# Patient Record
Sex: Female | Born: 1960 | Race: White | Hispanic: No | Marital: Married | Smoking: Current every day smoker
Health system: Southern US, Community
[De-identification: ages and names within clinical notes are randomized; demographics above are authoritative.]

## PROBLEM LIST (undated history)

## (undated) DIAGNOSIS — E785 Hyperlipidemia, unspecified: Secondary | ICD-10-CM

## (undated) DIAGNOSIS — G4700 Insomnia, unspecified: Secondary | ICD-10-CM

## (undated) HISTORY — DX: Hyperlipidemia, unspecified: E78.5

---

## 2012-01-27 HISTORY — PX: FOREARM SURGERY: SHX651

## 2016-05-22 ENCOUNTER — Ambulatory Visit (INDEPENDENT_AMBULATORY_CARE_PROVIDER_SITE_OTHER): Payer: BLUE CROSS/BLUE SHIELD | Admitting: Family Medicine

## 2016-05-22 ENCOUNTER — Encounter: Payer: Self-pay | Admitting: Family Medicine

## 2016-05-22 VITALS — BP 133/85 | HR 75 | Temp 97.1°F | Ht 62.0 in | Wt 144.0 lb

## 2016-05-22 DIAGNOSIS — F5101 Primary insomnia: Secondary | ICD-10-CM

## 2016-05-22 DIAGNOSIS — E782 Mixed hyperlipidemia: Secondary | ICD-10-CM | POA: Insufficient documentation

## 2016-05-22 DIAGNOSIS — Z0000 Encounter for general adult medical examination without abnormal findings: Secondary | ICD-10-CM

## 2016-05-22 NOTE — Progress Notes (Signed)
Subjective:  Patient ID: Yvette Rodgers. Yvette Rodgers, female    DOB: 09-25-1960  Age: 56 y.o. MRN: [id number]  CC: New Patient (Initial Visit) (pt here today to establish care and Work Physical)   HPI Yvette Rodgers presents for CPE. Homemaker.  Exercise - walking 2 miles daily. Smoker, but only takes 3-4 puffs per cigarette. Denies dyspnea. Afraid of chantix.  History Yvette Rodgers has a past medical history of Hyperlipidemia.   She has a past surgical history that includes Forearm surgery (Left, 2014).   Her family history includes Arthritis in her mother; Cancer in her sister; Hearing loss in her mother; Heart disease in her mother; Hypertension in her father and mother.She reports that she has been smoking.  She has been smoking about 0.50 packs per day. She has never used smokeless tobacco. She reports that she drinks about 1.8 oz of alcohol per week . She reports that she does not use drugs.  No current outpatient prescriptions on file prior to visit.   No current facility-administered medications on file prior to visit.     ROS Review of Systems  Constitutional: Negative for activity change, appetite change and fever.  HENT: Negative for congestion, rhinorrhea and sore throat.   Eyes: Negative for visual disturbance.  Respiratory: Negative for cough and shortness of breath.   Cardiovascular: Negative for chest pain and palpitations.  Gastrointestinal: Negative for abdominal pain, diarrhea and nausea.  Genitourinary: Negative for dysuria.  Musculoskeletal: Negative for arthralgias and myalgias.  Psychiatric/Behavioral: Positive for sleep disturbance (insomnia).    Objective:  BP 133/85   Pulse 75   Temp 97.1 F (36.2 C) (Oral)   Ht _0  (1.575 m)   Wt 144 lb (65.3 kg)   BMI 26.34 kg/m   Physical Exam  Constitutional: She is oriented to person, place, and time. She appears well-developed and well-nourished. No distress.  HENT:  Head: Normocephalic and atraumatic.  Right  Ear: External ear normal.  Left Ear: External ear normal.  Nose: Nose normal.  Mouth/Throat: Oropharynx is clear and moist.  Eyes: Conjunctivae and EOM are normal. Pupils are equal, round, and reactive to light.  Neck: Normal range of motion. Neck supple. No thyromegaly present.  Cardiovascular: Normal rate, regular rhythm and normal heart sounds.   No murmur heard. Pulmonary/Chest: Effort normal and breath sounds normal. No respiratory distress. She has no wheezes. She has no rales.  Abdominal: Soft. Normal appearance and bowel sounds are normal. She exhibits no distension, no abdominal bruit and no mass. There is no splenomegaly or hepatomegaly. There is no tenderness. There is no tenderness at McBurney's point and negative Murphy's sign.  Genitourinary: Rectum normal.  Musculoskeletal: Normal range of motion. She exhibits no edema or tenderness.  Lymphadenopathy:    She has no cervical adenopathy.  Neurological: She is alert and oriented to person, place, and time. She has normal reflexes.  Skin: Skin is warm and dry. No rash noted.  Psychiatric: She has a normal mood and affect. Her behavior is normal. Judgment and thought content normal.    Assessment & Plan:   Yvette Rodgers was seen today for new patient (initial visit).  Diagnoses and all orders for this visit:  Well adult exam -     CBC with Differential/Platelet -     CMP14+EGFR -     Lipid panel  Mixed hyperlipidemia  Primary insomnia   I am having Ms. Masih maintain her zolpidem, lovastatin, and lovastatin.  Meds ordered this encounter  Medications  .  zolpidem (AMBIEN) 10 MG tablet    Sig: Take 10 mg by mouth at bedtime as needed for sleep.  Marland Kitchen lovastatin (MEVACOR) 20 MG tablet    Sig: Take 20 mg by mouth at bedtime.  . lovastatin (MEVACOR) 40 MG tablet    Sig: Take 40 mg by mouth at bedtime.  Declined GYN exam, DEXA, mammogram. She wants to check with her insurance first. Multiple smoking cessation techniques  reviewed. Patient prefers to just taper little by little Follow-up: Return in about 6 months (around 11/21/2016), or if symptoms worsen or fail to improve, for cholesterol.  [name], M.D.

## 2016-05-23 LAB — CBC WITH DIFFERENTIAL/PLATELET
BASOS ABS: 0 10*3/uL (ref 0.0–0.2)
Basos: 1 %
EOS (ABSOLUTE): 0.2 10*3/uL (ref 0.0–0.4)
Eos: 2 %
Hematocrit: 45.4 % (ref 34.0–46.6)
Hemoglobin: 15.7 g/dL (ref 11.1–15.9)
IMMATURE GRANS (ABS): 0 10*3/uL (ref 0.0–0.1)
IMMATURE GRANULOCYTES: 0 %
LYMPHS: 46 %
Lymphocytes Absolute: 3.5 10*3/uL — ABNORMAL HIGH (ref 0.7–3.1)
MCH: 31 pg (ref 26.6–33.0)
MCHC: 34.6 g/dL (ref 31.5–35.7)
MCV: 90 fL (ref 79–97)
MONOS ABS: 0.5 10*3/uL (ref 0.1–0.9)
Monocytes: 7 %
NEUTROS ABS: 3.3 10*3/uL (ref 1.4–7.0)
NEUTROS PCT: 44 %
PLATELETS: 437 10*3/uL — AB (ref 150–379)
RBC: 5.06 x10E6/uL (ref 3.77–5.28)
RDW: 13.6 % (ref 12.3–15.4)
WBC: 7.6 10*3/uL (ref 3.4–10.8)

## 2016-05-23 LAB — CMP14+EGFR
A/G RATIO: 2 (ref 1.2–2.2)
ALT: 24 IU/L (ref 0–32)
AST: 17 IU/L (ref 0–40)
Albumin: 5 g/dL (ref 3.5–5.5)
Alkaline Phosphatase: 78 IU/L (ref 39–117)
BILIRUBIN TOTAL: 0.6 mg/dL (ref 0.0–1.2)
BUN/Creatinine Ratio: 18 (ref 9–23)
BUN: 15 mg/dL (ref 6–24)
CHLORIDE: 100 mmol/L (ref 96–106)
CO2: 22 mmol/L (ref 18–29)
Calcium: 10.3 mg/dL — ABNORMAL HIGH (ref 8.7–10.2)
Creatinine, Ser: 0.83 mg/dL (ref 0.57–1.00)
GFR calc non Af Amer: 78 mL/min/{1.73_m2} (ref >59)
GFR, EST AFRICAN AMERICAN: 90 mL/min/{1.73_m2} (ref >59)
GLUCOSE: 82 mg/dL (ref 65–99)
Globulin, Total: 2.5 g/dL (ref 1.5–4.5)
POTASSIUM: 5.1 mmol/L (ref 3.5–5.2)
Sodium: 140 mmol/L (ref 134–144)
TOTAL PROTEIN: 7.5 g/dL (ref 6.0–8.5)

## 2016-05-23 LAB — LIPID PANEL
CHOL/HDL RATIO: 3.6 ratio (ref 0.0–4.4)
Cholesterol, Total: 226 mg/dL — ABNORMAL HIGH (ref 100–199)
HDL: 62 mg/dL (ref >39)
LDL CALC: 107 mg/dL — AB (ref 0–99)
Triglycerides: 285 mg/dL — ABNORMAL HIGH (ref 0–149)
VLDL CHOLESTEROL CAL: 57 mg/dL — AB (ref 5–40)

## 2016-05-28 ENCOUNTER — Telehealth: Payer: Self-pay | Admitting: Family Medicine

## 2016-05-28 ENCOUNTER — Other Ambulatory Visit: Payer: Self-pay | Admitting: *Deleted

## 2016-05-28 MED ORDER — LOVASTATIN 40 MG PO TABS: 40.0000 mg | ORAL_TABLET | Freq: Every day | ORAL | 1 refills | Status: DC

## 2016-05-28 MED ORDER — LOVASTATIN 20 MG PO TABS: 20.0000 mg | ORAL_TABLET | Freq: Every day | ORAL | 1 refills | Status: DC

## 2016-05-28 MED ORDER — ZOLPIDEM TARTRATE 10 MG PO TABS: 10.0000 mg | ORAL_TABLET | Freq: Every evening | ORAL | 5 refills | Status: DC | PRN

## 2016-05-28 NOTE — Telephone Encounter (Signed)
scrip done, please call in.

## 2016-05-28 NOTE — Telephone Encounter (Signed)
Patient was seen 05/22/16. Please advise and send back to the pools.

## 2016-05-28 NOTE — Telephone Encounter (Signed)
Rx called in. Patient aware.  

## 2016-06-16 ENCOUNTER — Telehealth: Payer: Self-pay | Admitting: Family Medicine

## 2016-12-03 ENCOUNTER — Other Ambulatory Visit: Payer: Self-pay | Admitting: Family Medicine

## 2016-12-07 ENCOUNTER — Ambulatory Visit: Payer: PRIVATE HEALTH INSURANCE | Admitting: Family Medicine

## 2016-12-07 ENCOUNTER — Encounter: Payer: Self-pay | Admitting: Family Medicine

## 2016-12-07 VITALS — BP 128/79 | HR 84 | Temp 97.2°F | Ht 62.0 in | Wt 146.0 lb

## 2016-12-07 DIAGNOSIS — F5101 Primary insomnia: Secondary | ICD-10-CM | POA: Diagnosis not present

## 2016-12-07 DIAGNOSIS — E782 Mixed hyperlipidemia: Secondary | ICD-10-CM

## 2016-12-07 DIAGNOSIS — F4323 Adjustment disorder with mixed anxiety and depressed mood: Secondary | ICD-10-CM

## 2016-12-07 MED ORDER — ZOLPIDEM TARTRATE 10 MG PO TABS: 10.0000 mg | ORAL_TABLET | Freq: Every evening | ORAL | 5 refills | Status: DC | PRN

## 2016-12-07 MED ORDER — TRAZODONE HCL 150 MG PO TABS: 75.0000 mg | ORAL_TABLET | Freq: Every day | ORAL | 5 refills | Status: DC

## 2016-12-07 MED ORDER — LOVASTATIN 40 MG PO TABS: 80.0000 mg | ORAL_TABLET | Freq: Every day | ORAL | 2 refills | Status: DC
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
## 2016-12-08 LAB — CMP14+EGFR
ALBUMIN: 4.5 g/dL (ref 3.5–5.5)
ALT: 98 IU/L — AB (ref 0–32)
AST: 88 IU/L — ABNORMAL HIGH (ref 0–40)
Albumin/Globulin Ratio: 2 (ref 1.2–2.2)
Alkaline Phosphatase: 103 IU/L (ref 39–117)
BILIRUBIN TOTAL: 0.4 mg/dL (ref 0.0–1.2)
BUN/Creatinine Ratio: 18 (ref 9–23)
BUN: 15 mg/dL (ref 6–24)
CO2: 24 mmol/L (ref 20–29)
CREATININE: 0.85 mg/dL (ref 0.57–1.00)
Calcium: 9.6 mg/dL (ref 8.7–10.2)
Chloride: 106 mmol/L (ref 96–106)
GFR calc non Af Amer: 76 mL/min/{1.73_m2} (ref >59)
GFR, EST AFRICAN AMERICAN: 87 mL/min/{1.73_m2} (ref >59)
GLUCOSE: 90 mg/dL (ref 65–99)
Globulin, Total: 2.3 g/dL (ref 1.5–4.5)
POTASSIUM: 4.5 mmol/L (ref 3.5–5.2)
SODIUM: 144 mmol/L (ref 134–144)
TOTAL PROTEIN: 6.8 g/dL (ref 6.0–8.5)

## 2016-12-08 LAB — LIPID PANEL
CHOL/HDL RATIO: 2.6 ratio (ref 0.0–4.4)
Cholesterol, Total: 172 mg/dL (ref 100–199)
HDL: 66 mg/dL (ref >39)
LDL Calculated: 61 mg/dL (ref 0–99)
Triglycerides: 227 mg/dL — ABNORMAL HIGH (ref 0–149)
VLDL Cholesterol Cal: 45 mg/dL — ABNORMAL HIGH (ref 5–40)

## 2016-12-08 NOTE — Progress Notes (Signed)
There is a mild increase in the liver enzymes.  I recommend a repeat in about 4-6 weeks.  No change in therapy at this time.

## 2016-12-10 ENCOUNTER — Other Ambulatory Visit: Payer: Self-pay

## 2016-12-10 DIAGNOSIS — R748 Abnormal levels of other serum enzymes: Secondary | ICD-10-CM

## 2016-12-30 ENCOUNTER — Other Ambulatory Visit: Payer: Self-pay

## 2016-12-30 ENCOUNTER — Emergency Department (HOSPITAL_COMMUNITY)
Admission: EM | Admit: 2016-12-30 | Discharge: 2016-12-30 | Disposition: A | Discharge status: Home / Self Care | Payer: PRIVATE HEALTH INSURANCE | Attending: Emergency Medicine | Admitting: Emergency Medicine

## 2016-12-30 ENCOUNTER — Emergency Department (HOSPITAL_COMMUNITY): Payer: PRIVATE HEALTH INSURANCE

## 2016-12-30 ENCOUNTER — Encounter (HOSPITAL_COMMUNITY): Payer: Self-pay | Admitting: Emergency Medicine

## 2016-12-30 DIAGNOSIS — R911 Solitary pulmonary nodule: Secondary | ICD-10-CM

## 2016-12-30 DIAGNOSIS — Y999 Unspecified external cause status: Secondary | ICD-10-CM | POA: Insufficient documentation

## 2016-12-30 DIAGNOSIS — S0230XA Fracture of orbital floor, unspecified side, initial encounter for closed fracture: Secondary | ICD-10-CM

## 2016-12-30 DIAGNOSIS — Y9389 Activity, other specified: Secondary | ICD-10-CM | POA: Insufficient documentation

## 2016-12-30 DIAGNOSIS — S0990XA Unspecified injury of head, initial encounter: Secondary | ICD-10-CM | POA: Diagnosis present

## 2016-12-30 DIAGNOSIS — Y92410 Unspecified street and highway as the place of occurrence of the external cause: Secondary | ICD-10-CM | POA: Insufficient documentation

## 2016-12-30 DIAGNOSIS — S0231XA Fracture of orbital floor, right side, initial encounter for closed fracture: Secondary | ICD-10-CM | POA: Diagnosis not present

## 2016-12-30 LAB — BASIC METABOLIC PANEL
ANION GAP: 11 (ref 5–15)
BUN: 19 mg/dL (ref 6–20)
CHLORIDE: 106 mmol/L (ref 101–111)
CO2: 25 mmol/L (ref 22–32)
CREATININE: 0.93 mg/dL (ref 0.44–1.00)
Calcium: 9.8 mg/dL (ref 8.9–10.3)
GFR calc non Af Amer: >60 mL/min (ref >60)
GLUCOSE: 92 mg/dL (ref 65–99)
Potassium: 3.9 mmol/L (ref 3.5–5.1)
Sodium: 142 mmol/L (ref 135–145)

## 2016-12-30 LAB — PROTIME-INR
INR: 0.96
Prothrombin Time: 12.7 seconds (ref 11.4–15.2)

## 2016-12-30 LAB — CBC
HEMATOCRIT: 45.9 % (ref 36.0–46.0)
HEMOGLOBIN: 15.3 g/dL — AB (ref 12.0–15.0)
MCH: 30.7 pg (ref 26.0–34.0)
MCHC: 33.3 g/dL (ref 30.0–36.0)
MCV: 92.2 fL (ref 78.0–100.0)
Platelets: 393 10*3/uL (ref 150–400)
RBC: 4.98 MIL/uL (ref 3.87–5.11)
RDW: 12.7 % (ref 11.5–15.5)
WBC: 17.6 10*3/uL — ABNORMAL HIGH (ref 4.0–10.5)

## 2016-12-30 LAB — ETHANOL

## 2016-12-30 MED ORDER — TRAMADOL HCL 50 MG PO TABS: 50.0000 mg | ORAL_TABLET | Freq: Four times a day (QID) | ORAL | 0 refills | Status: DC | PRN

## 2016-12-30 MED ORDER — IOPAMIDOL (ISOVUE-300) INJECTION 61%
100.0000 mL | Freq: Once | INTRAVENOUS | Status: AC | PRN
  Administered 2016-12-30: 100 mL via INTRAVENOUS

## 2016-12-30 MED ORDER — HYDROCODONE-ACETAMINOPHEN 5-325 MG PO TABS
1.0000 | ORAL_TABLET | ORAL | Status: AC
  Administered 2016-12-30: 1 via ORAL
  Filled 2016-12-30: qty 1
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
## 2016-12-30 NOTE — Discharge Instructions (Signed)
Apply ice to help with the swelling, follow-up with the eye doctor and the facial trauma doctor, call to schedule a follow-up appointment.  Monitor for worsening ha, vomiting
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
## 2017-01-01 ENCOUNTER — Ambulatory Visit: Payer: PRIVATE HEALTH INSURANCE | Admitting: Family Medicine

## 2017-01-06 ENCOUNTER — Telehealth: Payer: Self-pay | Admitting: Family Medicine

## 2017-01-07 NOTE — Telephone Encounter (Signed)
Pt called to schedule appt for pt Pt declined earlier appt Will come in on 01/15/2017

## 2017-01-15 ENCOUNTER — Encounter: Payer: Self-pay | Admitting: Family Medicine

## 2017-01-15 ENCOUNTER — Ambulatory Visit: Payer: PRIVATE HEALTH INSURANCE | Admitting: Family Medicine

## 2017-01-15 VITALS — BP 115/74 | HR 90 | Temp 97.1°F | Ht 62.0 in | Wt 141.0 lb

## 2017-01-15 DIAGNOSIS — E782 Mixed hyperlipidemia: Secondary | ICD-10-CM

## 2017-01-15 DIAGNOSIS — F5101 Primary insomnia: Secondary | ICD-10-CM | POA: Diagnosis not present

## 2017-01-15 NOTE — Progress Notes (Signed)
Subjective:  Patient ID: Yvette Rodgers, female    DOB: 09-10-1960  Age: 56 y.o. MRN: [id number]  CC: Hyperlipidemia (pt here today for routine follow up of her chronic medical conditions, no other concerns voiced.)   HPI Yvette Rodgers presents for follow-up cholesterol is patient in for follow-up of elevated cholesterol. Doing well without complaints on current medication. Denies side effects of statin including myalgia and arthralgia and nausea. Also in today for liver function testing. Currently no chest pain, shortness of breath or other cardiovascular related symptoms noted.   [facility] 12/07/2016 05/22/2016  Decreased Interest 0 0  Down, Depressed, Hopeless 0 0  PHQ - 2 Score 0 0    History Yvette Hashimotoatricia has a past medical history of Hyperlipidemia.   She has a past surgical history that includes Forearm surgery (Left, 2014).   Her family history includes Arthritis in her mother; Cancer in her sister; Hearing loss in her mother; Heart disease in her mother; Hypertension in her father and mother.She reports that she has been smoking.  She has been smoking about 0.50 packs per day. she has never used smokeless tobacco. She reports that she drinks about 1.8 oz of alcohol per week. She reports that she does not use drugs.    ROS Review of Systems  Constitutional: Negative for activity change, appetite change and fever.  HENT: Negative for congestion, rhinorrhea and sore throat.   Eyes: Negative for visual disturbance.  Respiratory: Negative for cough and shortness of breath.   Cardiovascular: Negative for chest pain and palpitations.  Gastrointestinal: Negative for abdominal pain, diarrhea and nausea.  Genitourinary: Negative for dysuria.  Musculoskeletal: Negative for arthralgias and myalgias.    Objective:  BP 115/74   Pulse 90   Temp (!) 97.1 F (36.2 C) (Oral)   Ht 5\' 2"  (1.575 m)   Wt 141 lb (64 kg)   BMI 25.79 kg/m   BP Readings from Last 3  Encounters:  01/15/17 115/74  12/30/16 120/62  12/07/16 128/79    Wt Readings from Last 3 Encounters:  01/15/17 141 lb (64 kg)  12/30/16 150 lb (68 kg)  12/07/16 146 lb (66.2 kg)     Physical Exam  Constitutional: She is oriented to person, place, and time. She appears well-developed and well-nourished. No distress.  HENT:  Head: Normocephalic and atraumatic.  Right Ear: External ear normal.  Left Ear: External ear normal.  Nose: Nose normal.  Mouth/Throat: Oropharynx is clear and moist.  Eyes: Conjunctivae and EOM are normal. Pupils are equal, round, and reactive to light.  Neck: Normal range of motion. Neck supple. No thyromegaly present.  Cardiovascular: Normal rate, regular rhythm and normal heart sounds.  No murmur heard. Pulmonary/Chest: Effort normal and breath sounds normal. No respiratory distress. She has no wheezes. She has no rales.  Abdominal: Soft. Bowel sounds are normal. She exhibits no distension. There is no tenderness.  Lymphadenopathy:    She has no cervical adenopathy.  Neurological: She is alert and oriented to person, place, and time. She has normal reflexes.  Skin: Skin is warm and dry.  Psychiatric: She has a normal mood and affect. Her behavior is normal. Judgment and thought content normal.      Assessment & Plan:   Yvette Hashimotoatricia was seen today for hyperlipidemia.  Diagnoses and all orders for this visit:  Mixed hyperlipidemia  Primary insomnia       I am having Yvette HashimotoPatricia A. Montez Rodgers maintain her lovastatin, traZODone, zolpidem, and  traMADol.  Allergies as of 01/15/2017   No Known Allergies     Medication List        Accurate as of 01/15/17 11:59 PM. Always use your most recent med list.          lovastatin 40 MG tablet Commonly known as:  MEVACOR Take 2 tablets (80 mg total) at bedtime by mouth.   traMADol 50 MG tablet Commonly known as:  ULTRAM Take 1 tablet (50 mg total) by mouth every 6 (six) hours as needed.   traZODone  150 MG tablet Commonly known as:  DESYREL Take 0.5-1 tablets (75-150 mg total) at bedtime by mouth.   zolpidem 10 MG tablet Commonly known as:  AMBIEN Take 1 tablet (10 mg total) at bedtime as needed by mouth for sleep.        Follow-up: Return in about 6 months (around 07/16/2017).  Mechele ClaudeWarren Pang Robers, M.D.

## 2017-01-26 ENCOUNTER — Encounter: Payer: Self-pay | Admitting: Family Medicine

## 2017-02-17 ENCOUNTER — Encounter: Payer: Self-pay | Admitting: Family Medicine

## 2017-02-17 ENCOUNTER — Ambulatory Visit (INDEPENDENT_AMBULATORY_CARE_PROVIDER_SITE_OTHER): Payer: PRIVATE HEALTH INSURANCE | Admitting: Family Medicine

## 2017-02-17 VITALS — BP 129/87 | HR 83 | Temp 97.0°F | Ht 62.0 in | Wt 140.0 lb

## 2017-02-17 DIAGNOSIS — M545 Low back pain, unspecified: Secondary | ICD-10-CM

## 2017-02-17 MED ORDER — BETAMETHASONE SOD PHOS & ACET 6 (3-3) MG/ML IJ SUSP
6.0000 mg | Freq: Once | INTRAMUSCULAR | Status: AC
  Administered 2017-02-17: 6 mg via INTRAMUSCULAR

## 2017-02-17 MED ORDER — CYCLOBENZAPRINE HCL 10 MG PO TABS: 10.0000 mg | ORAL_TABLET | Freq: Three times a day (TID) | ORAL | 0 refills | Status: DC | PRN

## 2017-02-17 MED ORDER — DICLOFENAC SODIUM 75 MG PO TBEC: 75.0000 mg | DELAYED_RELEASE_TABLET | Freq: Two times a day (BID) | ORAL | 0 refills | Status: DC

## 2017-02-17 NOTE — Patient Instructions (Signed)
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
## 2017-03-13 ENCOUNTER — Emergency Department (HOSPITAL_COMMUNITY): Payer: PRIVATE HEALTH INSURANCE

## 2017-03-13 ENCOUNTER — Emergency Department (HOSPITAL_COMMUNITY)
Admission: EM | Admit: 2017-03-13 | Discharge: 2017-03-14 | Disposition: A | Discharge status: Home / Self Care | Payer: PRIVATE HEALTH INSURANCE | Attending: Emergency Medicine | Admitting: Emergency Medicine

## 2017-03-13 ENCOUNTER — Other Ambulatory Visit: Payer: Self-pay

## 2017-03-13 ENCOUNTER — Encounter (HOSPITAL_COMMUNITY): Payer: Self-pay | Admitting: *Deleted

## 2017-03-13 DIAGNOSIS — W19XXXA Unspecified fall, initial encounter: Secondary | ICD-10-CM | POA: Diagnosis not present

## 2017-03-13 DIAGNOSIS — E785 Hyperlipidemia, unspecified: Secondary | ICD-10-CM | POA: Insufficient documentation

## 2017-03-13 DIAGNOSIS — Z79899 Other long term (current) drug therapy: Secondary | ICD-10-CM | POA: Insufficient documentation

## 2017-03-13 DIAGNOSIS — Y998 Other external cause status: Secondary | ICD-10-CM | POA: Insufficient documentation

## 2017-03-13 DIAGNOSIS — Y929 Unspecified place or not applicable: Secondary | ICD-10-CM | POA: Insufficient documentation

## 2017-03-13 DIAGNOSIS — Y939 Activity, unspecified: Secondary | ICD-10-CM | POA: Diagnosis not present

## 2017-03-13 DIAGNOSIS — S0101XA Laceration without foreign body of scalp, initial encounter: Secondary | ICD-10-CM | POA: Insufficient documentation

## 2017-03-13 DIAGNOSIS — S0003XA Contusion of scalp, initial encounter: Secondary | ICD-10-CM

## 2017-03-13 DIAGNOSIS — F17200 Nicotine dependence, unspecified, uncomplicated: Secondary | ICD-10-CM | POA: Diagnosis not present

## 2017-03-13 HISTORY — DX: Insomnia, unspecified: G47.00

## 2017-03-13 MED ORDER — HYDROCODONE-ACETAMINOPHEN 5-325 MG PO TABS
1.0000 | ORAL_TABLET | Freq: Once | ORAL | Status: AC
  Administered 2017-03-13: 1 via ORAL
  Filled 2017-03-13: qty 1

## 2017-03-13 MED ORDER — NAPROXEN 250 MG PO TABS
500.0000 mg | ORAL_TABLET | Freq: Once | ORAL | Status: AC
  Administered 2017-03-14: 500 mg via ORAL
  Filled 2017-03-13: qty 2

## 2017-03-13 MED ORDER — TETANUS-DIPHTH-ACELL PERTUSSIS 5-2.5-18.5 LF-MCG/0.5 IM SUSP: 0.5000 mL | Freq: Once | INTRAMUSCULAR | Status: DC

## 2017-03-13 NOTE — ED Notes (Signed)
Dr Rhunette CroftNanavati has spoke with pt about having CT head scan. Pt has agreed to have scan.

## 2017-03-13 NOTE — ED Notes (Signed)
Yvette Rodgers in [place] came to transport pt for head CT- pt refused this scan. Dr Rhunette CroftNanavati aware.
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
## 2017-03-14 MED ORDER — ACETAMINOPHEN ER 650 MG PO TBCR: 650.0000 mg | EXTENDED_RELEASE_TABLET | Freq: Three times a day (TID) | ORAL | 0 refills | Status: AC

## 2017-03-14 MED ORDER — ACETAMINOPHEN 325 MG PO TABS
650.0000 mg | ORAL_TABLET | Freq: Once | ORAL | Status: AC
  Administered 2017-03-14: 650 mg via ORAL
  Filled 2017-03-14: qty 2

## 2017-03-14 MED ORDER — IBUPROFEN 400 MG PO TABS: 400.0000 mg | ORAL_TABLET | Freq: Four times a day (QID) | ORAL | 0 refills | Status: DC | PRN
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
## 2017-03-14 NOTE — Discharge Instructions (Signed)
We saw you in the ER after you had a fall and resultant laceration to the scalp. We had to put in 7 staples to your scalp. Please keep the area clean and dry.  You may start using shampoo after 48 hours. Get the staples removed in 10-14 days by her primary care doctor.

## 2017-03-24 ENCOUNTER — Encounter: Payer: Self-pay | Admitting: Family Medicine

## 2017-03-24 ENCOUNTER — Ambulatory Visit: Payer: PRIVATE HEALTH INSURANCE | Admitting: Family Medicine

## 2017-03-24 VITALS — BP 128/84 | HR 77 | Temp 97.2°F | Ht 62.0 in | Wt 140.0 lb

## 2017-03-24 DIAGNOSIS — S0003XA Contusion of scalp, initial encounter: Secondary | ICD-10-CM

## 2017-03-24 DIAGNOSIS — M5431 Sciatica, right side: Secondary | ICD-10-CM

## 2017-03-24 MED ORDER — BETAMETHASONE SOD PHOS & ACET 6 (3-3) MG/ML IJ SUSP
6.0000 mg | Freq: Once | INTRAMUSCULAR | Status: AC
  Administered 2017-03-24: 6 mg via INTRAMUSCULAR

## 2017-03-24 MED ORDER — CYCLOBENZAPRINE HCL 10 MG PO TABS: 10.0000 mg | ORAL_TABLET | Freq: Three times a day (TID) | ORAL | 0 refills | Status: DC | PRN

## 2017-03-24 NOTE — Progress Notes (Signed)
Chief Complaint  Patient presents with  . Suture / Staple Removal    pt here today to have staples removed from back of her head which were placed at ED     HPI  Patient presents today for recheck of her scalp wound.  She fell and hit her head about a week ago.  CT scan negative for injury but she had a laceration and several staples were placed.  She denies pain at this time.  No redness or swelling or drainage noted.  However, she reports that she hit her right hip and the pain that she was having last month at her previous visit has returned.  It is radiating down from the right buttock to the posterior lateral hip thigh and calf on the right.  She describes the pain as a burning explosive moderately severe sensation she is unable to get any sleep at all last night due to pain.  It seems to have been worsening over the last few days.  PMH: Smoking status noted ROS: Per HPI  Objective: BP 128/84   Pulse 77   Temp (!) 97.2 F (36.2 C) (Oral)   Ht 5\' 2"  (1.575 m)   Wt 140 lb (63.5 kg)   BMI 25.61 kg/m  Gen: NAD, alert, cooperative with exam HEENT: [place], EOMI, PERRL.  The lesion at the posterior parietal scalp on the left was inspected after suture removal revealing no sign of infection and with good healing. CV: RRR, good S1/S2, no murmur Resp: CTABL, no wheezes, non-labored Abd: SNTND, BS present, no guarding or organomegaly Ext: No edema, warm full range of motion right lower extremity.  Tenderness noted at sciatic notch.  There is mild tenderness with straight leg raise above 45 degrees.  Right lower extremity otherwise neurovascularly intact. Neuro: Alert and oriented, No gross deficits  Assessment and plan:  1. Sciatica of right side   2. Contusion of scalp, initial encounter     Meds ordered this encounter  Medications  . betamethasone acetate-betamethasone sodium phosphate (CELESTONE) injection 6 mg  . cyclobenzaprine (FLEXERIL) 10 MG tablet    Sig: Take 1 tablet (10 mg  total) by mouth 3 (three) times daily as needed for muscle spasms.    Dispense:  90 tablet    Refill:  0    Wound care reviewed with patient  Follow up as needed.  Mechele ClaudeWarren Ovie Eastep, MD

## 2017-03-30 ENCOUNTER — Ambulatory Visit: Payer: PRIVATE HEALTH INSURANCE | Admitting: Family Medicine

## 2017-03-31 ENCOUNTER — Other Ambulatory Visit: Payer: Self-pay | Admitting: Family Medicine

## 2017-03-31 ENCOUNTER — Telehealth: Payer: Self-pay | Admitting: Family Medicine

## 2017-03-31 MED ORDER — ZOLPIDEM TARTRATE 10 MG PO TABS: 10.0000 mg | ORAL_TABLET | Freq: Every evening | ORAL | 5 refills | Status: DC | PRN

## 2017-03-31 NOTE — Telephone Encounter (Signed)
What is the name of the medication? zolpidem (AMBIEN) 10 MG tablet  Have you contacted your pharmacy to request a refill? Yes told to call us  Which pharmacy would you like this sent to? [facility] in [place]   Patient notified that their request is being sent to the clinical staff for review and that they should receive a call once it is complete. If they do not receive a call within 24 hours they can check with their pharmacy or our office.

## 2017-03-31 NOTE — Telephone Encounter (Signed)
I sent in the requested prescription 

## 2017-03-31 NOTE — Telephone Encounter (Signed)
Patient aware.

## 2017-05-24 ENCOUNTER — Other Ambulatory Visit: Payer: Self-pay | Admitting: Family Medicine

## 2017-06-08 ENCOUNTER — Encounter: Payer: Self-pay | Admitting: Family

## 2017-06-08 ENCOUNTER — Ambulatory Visit: Payer: PRIVATE HEALTH INSURANCE | Admitting: Family

## 2017-06-08 VITALS — BP 97/62 | HR 90 | Temp 101.0°F | Ht 62.0 in | Wt 142.4 lb

## 2017-06-08 DIAGNOSIS — N12 Tubulo-interstitial nephritis, not specified as acute or chronic: Secondary | ICD-10-CM

## 2017-06-08 DIAGNOSIS — I959 Hypotension, unspecified: Secondary | ICD-10-CM | POA: Diagnosis not present

## 2017-06-08 DIAGNOSIS — R6889 Other general symptoms and signs: Secondary | ICD-10-CM

## 2017-06-08 DIAGNOSIS — M5431 Sciatica, right side: Secondary | ICD-10-CM

## 2017-06-08 DIAGNOSIS — R109 Unspecified abdominal pain: Secondary | ICD-10-CM

## 2017-06-08 LAB — VERITOR FLU A/B WAIVED
INFLUENZA A: NEGATIVE
INFLUENZA B: NEGATIVE

## 2017-06-08 MED ORDER — CIPROFLOXACIN HCL 500 MG PO TABS: 500.0000 mg | ORAL_TABLET | Freq: Two times a day (BID) | ORAL | 0 refills | Status: DC

## 2017-06-08 MED ORDER — CYCLOBENZAPRINE HCL 10 MG PO TABS: 10.0000 mg | ORAL_TABLET | Freq: Three times a day (TID) | ORAL | 0 refills | Status: DC | PRN

## 2017-06-08 NOTE — Progress Notes (Signed)
Subjective:    Patient ID: Yvette Rodgers, female    DOB: [birth year], 57 y.o.   MRN: [id number]  Chief Complaint  Patient presents with  . Back Pain  . Fever  . Headache  . Nausea  . leg aches   Pt presents to the office today with fevers for the last three days. States she has never been this sick before. She is also complaining of bilateral flank pain.   States she is having recurrent sciatic pain down her right leg and this is causing her not to sleep because of the pain.  Fever   This is a new problem. The current episode started in the past 7 days (three days ago). The problem occurs constantly. The maximum temperature noted was 102 to 102.9 F. Associated symptoms include coughing ("a little"), diarrhea (once), ear pain, headaches, muscle aches, nausea, sleepiness and a sore throat. Pertinent negatives include no congestion, urinary pain or vomiting. She has tried acetaminophen and fluids for the symptoms. The treatment provided mild relief.  Back Pain  This is a recurrent problem. Associated symptoms include a fever and headaches. Pertinent negatives include no dysuria.      Review of Systems  Constitutional: Positive for fever.  HENT: Positive for ear pain and sore throat. Negative for congestion.   Respiratory: Positive for cough ("a little").   Gastrointestinal: Positive for diarrhea (once) and nausea. Negative for vomiting.  Genitourinary: Negative for dysuria.  Musculoskeletal: Positive for back pain.  Neurological: Positive for headaches.  All other systems reviewed and are negative.      Objective:   Physical Exam  Constitutional: She is oriented to person, place, and time. She appears well-developed and well-nourished. She has a sickly appearance. No distress.  HENT:  Head: Normocephalic and atraumatic.  Right Ear: External ear normal.  Mouth/Throat: Oropharynx is clear and moist.  Eyes: Pupils are equal, round, and reactive to light.  Neck: Normal range  of motion. Neck supple. No thyromegaly present.  Cardiovascular: Normal rate, regular rhythm, normal heart sounds and intact distal pulses.  No murmur heard. Pulmonary/Chest: Effort normal and breath sounds normal. No respiratory distress. She has no wheezes.  Abdominal: Soft. Bowel sounds are normal. She exhibits no distension. There is no tenderness.  Musculoskeletal: Normal range of motion. She exhibits no edema or tenderness.  Neurological: She is alert and oriented to person, place, and time. She has normal reflexes. No cranial nerve deficit.  Skin: Skin is warm and dry.  Psychiatric: She has a normal mood and affect. Her behavior is normal. Judgment and thought content normal.  Vitals reviewed.  Flu negative    BP 97/62   Pulse 90   Temp (!) 101 F (38.3 C) (Oral)   Ht  (1.575 m)   Wt 142 lb 6.4 oz (64.6 kg)   BMI 26.05 kg/m      Assessment & Plan:  [name] was seen today for back pain, fever, headache, nausea and leg aches.  Diagnoses and all orders for this visit:  Flu-like symptoms -     Veritor Flu A/B Waived  Pyelonephritis -     ciprofloxacin (CIPRO) 500 MG tablet; Take 1 tablet (500 mg total) by mouth 2 (two) times daily.  Flank pain  Sciatica of right side -     cyclobenzaprine (FLEXERIL) 10 MG tablet; Take 1 tablet (10 mg total) by mouth 3 (three) times daily as needed for muscle spasms.  Hypotension, unspecified hypotension type   PT refuses  any urine tests or other work up stating she wants the "bare minium" as they are still paying on her hospital bill. I discussed the importance of lab work and urinalysis, but states she will talk with her husband and if she does not get better will come back for another appointment.  I worry about pyelonephritis with flank pain and high fever. Pt is hypotensive today and we discussed the importance of forcing fluids. But if symptoms do not improve or worsen she will need to go to ED.   Jannifer Rodney, FNP

## 2017-06-08 NOTE — Patient Instructions (Signed)
Pyelonephritis, Adult Pyelonephritis is a kidney infection. The kidneys are organs that help clean your blood by moving waste out of your blood and into your pee (urine). This infection can happen quickly, or it can last for a long time. In most cases, it clears up with treatment and does not cause other problems. Follow these instructions at home: Medicines  Take over-the-counter and prescription medicines only as told by your doctor.  Take your antibiotic medicine as told by your doctor. Do not stop taking the medicine even if you start to feel better. General instructions  Drink enough fluid to keep your pee clear or pale yellow.  Avoid caffeine, tea, and carbonated drinks.  Pee (urinate) often. Avoid holding in pee for long periods of time.  Pee before and after sex.  After pooping (having a bowel movement), women should wipe from front to back. Use each tissue only once.  Keep all follow-up visits as told by your doctor. This is important. Contact a doctor if:  You do not feel better after 2 days.  Your symptoms get worse.  You have a fever. Get help right away if:  You cannot take your medicine or drink fluids as told.  You have chills and shaking.  You throw up (vomit).  You have very bad pain in your side (flank) or back.  You feel very weak or you pass out (faint). This information is not intended to replace advice given to you by your health care provider. Make sure you discuss any questions you have with your health care provider. Document Released: 02/20/2004 Document Revised: 06/20/2015 Document Reviewed: 05/07/2014 Elsevier Interactive Patient Education  2018 [employer]  

## 2017-06-09 ENCOUNTER — Other Ambulatory Visit: Payer: PRIVATE HEALTH INSURANCE

## 2017-06-09 ENCOUNTER — Ambulatory Visit: Payer: PRIVATE HEALTH INSURANCE | Admitting: Family Medicine

## 2017-06-09 DIAGNOSIS — R109 Unspecified abdominal pain: Secondary | ICD-10-CM

## 2017-06-09 DIAGNOSIS — N12 Tubulo-interstitial nephritis, not specified as acute or chronic: Secondary | ICD-10-CM

## 2017-06-09 LAB — URINALYSIS, COMPLETE
Bilirubin, UA: NEGATIVE
GLUCOSE, UA: NEGATIVE
Leukocytes, UA: NEGATIVE
NITRITE UA: NEGATIVE
Specific Gravity, UA: 1.015 (ref 1.005–1.030)
Urobilinogen, Ur: 0.2 mg/dL (ref 0.2–1.0)
pH, UA: 5.5 (ref 5.0–7.5)

## 2017-06-09 LAB — MICROSCOPIC EXAMINATION
Epithelial Cells (non renal): >10 /hpf — AB (ref 0–10)
Renal Epithel, UA: NONE SEEN /hpf

## 2017-06-09 NOTE — Addendum Note (Signed)
Addended by: Almeta Monas on: 06/09/2017 02:33 PM   Modules accepted: Orders

## 2017-07-26 ENCOUNTER — Ambulatory Visit (INDEPENDENT_AMBULATORY_CARE_PROVIDER_SITE_OTHER): Payer: PRIVATE HEALTH INSURANCE | Admitting: Family Medicine

## 2017-07-26 ENCOUNTER — Encounter: Payer: Self-pay | Admitting: Family Medicine

## 2017-07-26 VITALS — BP 129/85 | HR 79 | Temp 97.2°F | Ht 62.0 in | Wt 136.0 lb

## 2017-07-26 DIAGNOSIS — M5416 Radiculopathy, lumbar region: Secondary | ICD-10-CM | POA: Diagnosis not present

## 2017-07-26 MED ORDER — PREDNISONE 10 MG PO TABS: ORAL_TABLET | ORAL | 0 refills | Status: DC

## 2017-07-26 MED ORDER — GABAPENTIN 300 MG PO CAPS: ORAL_CAPSULE | ORAL | 0 refills | Status: DC
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
## 2017-08-28 ENCOUNTER — Other Ambulatory Visit: Payer: Self-pay | Admitting: Family Medicine

## 2017-08-30 NOTE — Telephone Encounter (Signed)
Last lipid 12/07/16

## 2017-08-31 ENCOUNTER — Ambulatory Visit: Payer: PRIVATE HEALTH INSURANCE | Admitting: Family Medicine

## 2017-10-01 ENCOUNTER — Other Ambulatory Visit: Payer: Self-pay | Admitting: Family Medicine

## 2017-10-01 NOTE — Telephone Encounter (Signed)
Last seen 07/26/17  Dr Darlyn Read

## 2017-11-29 ENCOUNTER — Other Ambulatory Visit: Payer: Self-pay | Admitting: Family Medicine

## 2017-11-29 NOTE — Telephone Encounter (Signed)
Last seen 07/26/17

## 2017-12-17 ENCOUNTER — Telehealth: Payer: Self-pay | Admitting: Family Medicine

## 2017-12-20 ENCOUNTER — Other Ambulatory Visit: Payer: Self-pay | Admitting: Family Medicine

## 2017-12-20 MED ORDER — ZOLPIDEM TARTRATE 10 MG PO TABS: 10.0000 mg | ORAL_TABLET | Freq: Every evening | ORAL | 2 refills | Status: DC | PRN

## 2017-12-20 MED ORDER — DICLOFENAC SODIUM 75 MG PO TBEC: 75.0000 mg | DELAYED_RELEASE_TABLET | Freq: Two times a day (BID) | ORAL | 1 refills | Status: DC

## 2017-12-20 MED ORDER — GABAPENTIN 300 MG PO CAPS: ORAL_CAPSULE | ORAL | 1 refills | Status: DC

## 2017-12-20 MED ORDER — LOVASTATIN 40 MG PO TABS: 80.0000 mg | ORAL_TABLET | Freq: Every day | ORAL | 1 refills | Status: DC

## 2017-12-20 NOTE — Telephone Encounter (Signed)
Patient aware.

## 2017-12-20 NOTE — Telephone Encounter (Signed)
I sent in the requested prescription 

## 2017-12-28 ENCOUNTER — Telehealth: Payer: Self-pay | Admitting: *Deleted

## 2017-12-28 NOTE — Telephone Encounter (Signed)
Advised pt she would ntbs per Dr Darlyn ReadStacks and she states she lives in [place] and will probably be moving to another state in a few months. Advised pt she would need to find a provider near where she lives. Pt voiced understanding.

## 2017-12-28 NOTE — Telephone Encounter (Signed)
Pt. Needs to be seen for this. Thanks, WS 

## 2017-12-28 NOTE — Telephone Encounter (Signed)
VM from patient regarding Ambien Ambien will be due in a couple of days, this was called into [facility] on 12/20/17 #30 with 2 refills but [facility] not be [facility] [place] since it is a controlled substance If appropriate please send new Rx

## 2018-10-05 ENCOUNTER — Encounter: Payer: Self-pay | Admitting: Family Medicine

## 2018-10-05 ENCOUNTER — Ambulatory Visit (INDEPENDENT_AMBULATORY_CARE_PROVIDER_SITE_OTHER): Payer: PRIVATE HEALTH INSURANCE | Admitting: Family Medicine

## 2018-10-05 DIAGNOSIS — F5101 Primary insomnia: Secondary | ICD-10-CM

## 2018-10-05 DIAGNOSIS — M5431 Sciatica, right side: Secondary | ICD-10-CM | POA: Diagnosis not present

## 2018-10-05 DIAGNOSIS — E782 Mixed hyperlipidemia: Secondary | ICD-10-CM

## 2018-10-05 MED ORDER — ZOLPIDEM TARTRATE 10 MG PO TABS: 10.0000 mg | ORAL_TABLET | Freq: Every evening | ORAL | 2 refills | Status: DC | PRN

## 2018-10-05 NOTE — Progress Notes (Signed)
    Subjective:    Patient ID: Yvette Rodgers. [place], female    DOB: 1960-08-03, 58 y.o.   MRN: [id number]   HPI: Yvette Rodgers. [name] is a 59 y.o. female presenting for recheck of lower back. Went to [place] last week and back is fine. No more problems with back or leg. Didn't get MRI as a result. Taking lovastatin and ambien, but nothing for her back. No pain during  Drive to [facility] did not exacerbate pain.    Depression screen [facility] 2/9 07/26/2017 06/08/2017 03/24/2017 02/17/2017 12/07/2016  Decreased Interest 0 0 0 0 0  Down, Depressed, Hopeless 0 0 0 0 0  PHQ - 2 Score 0 0 0 0 0     Relevant past medical, surgical, family and social history reviewed and updated as indicated.  Interim medical history since our last visit reviewed. Allergies and medications reviewed and updated.  ROS:  Review of Systems  Constitutional: Negative.   HENT: Negative.   Eyes: Negative for visual disturbance.  Respiratory: Negative for shortness of breath.   Cardiovascular: Negative for chest pain.  Gastrointestinal: Negative for abdominal pain.  Musculoskeletal: Negative for arthralgias.     Social History   Tobacco Use  Smoking Status Current Every Day Smoker  . Packs/day: 0.50  Smokeless Tobacco Never Used       Objective:     Wt Readings from Last 3 Encounters:  07/26/17 136 lb (61.7 kg)  06/08/17 142 lb 6.4 oz (64.6 kg)  03/24/17 140 lb (63.5 kg)     Exam deferred. Pt. Harboring due to COVID 19. Phone visit performed.   Assessment & Plan:   1. Primary insomnia   2. Mixed hyperlipidemia   3. Sciatica of right side     Meds ordered this encounter  Medications  . zolpidem (AMBIEN) 10 MG tablet    Sig: Take 1 tablet (10 mg total) by mouth at bedtime as needed. for sleep    Dispense:  30 tablet    Refill:  2    No orders of the defined types were placed in this encounter.     Diagnoses and all orders for this visit:  Primary insomnia  Mixed hyperlipidemia  Sciatica  of right side  Other orders -     zolpidem (AMBIEN) 10 MG tablet; Take 1 tablet (10 mg total) by mouth at bedtime as needed. for sleep    Virtual Visit via telephone Note  I discussed the limitations, risks, security and privacy concerns of performing an evaluation and management service by telephone and the availability of in person appointments. The patient was identified with two identifiers. Pt.expressed understanding and agreed to proceed. Pt. Is at home. Dr. Livia Snellen is in his office.  Follow Up Instructions:   I discussed the assessment and treatment plan with the patient. The patient was provided an opportunity to ask questions and all were answered. The patient agreed with the plan and demonstrated an understanding of the instructions.   The patient was advised to call back or seek an in-person evaluation if the symptoms worsen or if the condition fails to improve as anticipated.   Total minutes including chart review and phone contact time: 18   Follow up plan: Return in about 6 months (around 04/04/2019).  [name], MD [place]

## 2018-10-28 ENCOUNTER — Other Ambulatory Visit: Payer: Self-pay | Admitting: Family Medicine

## 2018-11-09 ENCOUNTER — Ambulatory Visit: Payer: PRIVATE HEALTH INSURANCE | Admitting: Family Medicine

## 2018-12-30 ENCOUNTER — Other Ambulatory Visit: Payer: Self-pay | Admitting: Family Medicine

## 2019-02-01 ENCOUNTER — Ambulatory Visit (INDEPENDENT_AMBULATORY_CARE_PROVIDER_SITE_OTHER): Payer: PRIVATE HEALTH INSURANCE | Admitting: Family Medicine

## 2019-02-01 ENCOUNTER — Encounter: Payer: Self-pay | Admitting: Family Medicine

## 2019-02-01 DIAGNOSIS — E782 Mixed hyperlipidemia: Secondary | ICD-10-CM

## 2019-02-01 DIAGNOSIS — F4321 Adjustment disorder with depressed mood: Secondary | ICD-10-CM | POA: Diagnosis not present

## 2019-02-01 DIAGNOSIS — F5101 Primary insomnia: Secondary | ICD-10-CM

## 2019-02-01 DIAGNOSIS — F411 Generalized anxiety disorder: Secondary | ICD-10-CM | POA: Diagnosis not present

## 2019-02-01 MED ORDER — ZOLPIDEM TARTRATE 10 MG PO TABS: 10.0000 mg | ORAL_TABLET | Freq: Every day | ORAL | 5 refills | Status: DC

## 2019-02-01 MED ORDER — LOVASTATIN 40 MG PO TABS: 80.0000 mg | ORAL_TABLET | Freq: Every day | ORAL | 5 refills | Status: DC

## 2019-02-01 NOTE — Progress Notes (Signed)
Subjective:    Patient ID: Yvette Rodgers, female    DOB: Apr 15, 1960, 59 y.o.   MRN: [id number]   HPI: Yvette Rodgers. Yvette Rodgers is a 60 y.o. female presenting for check up on insomnia. Ambien taken at 9 PM. Bedtime at 10 PM. Watches TV until bedtime. Takes about 1/2-1 hour to get to sleep. Then awakening close to 3 AM. Preoccupied by death of nephews. "My mind keeps moving." when trying to sleep.Drinks tea and  At 8:30 will drink a soda.  Lost 2 nephews in Dec. Unknown causes for he 40 year old. The other passed at age 29 on Dec. 31 due to cancer. Now awaiting cremation. Pt. Planning trip to funeral in [place].    Depression screen Yvette Rodgers 2/9 07/26/2017 06/08/2017 03/24/2017 02/17/2017 12/07/2016  Decreased Interest 0 0 0 0 0  Down, Depressed, Hopeless 0 0 0 0 0  PHQ - 2 Score 0 0 0 0 0     Relevant past medical, surgical, family and social history reviewed and updated as indicated.  Interim medical history since our last visit reviewed. Allergies and medications reviewed and updated.  ROS:  Review of Systems   Social History   Tobacco Use  Smoking Status Current Every Day Smoker  . Packs/day: 0.50  Smokeless Tobacco Never Used       Objective:     Wt Readings from Last 3 Encounters:  07/26/17 136 lb (61.7 kg)  06/08/17 142 lb 6.4 oz (64.6 kg)  03/24/17 140 lb (63.5 kg)     Exam deferred. Pt. Harboring due to COVID 19. Phone visit performed.   Assessment & Plan:   1. Primary insomnia   2. Anxiety reaction   3. Grieving   4. Mixed hyperlipidemia     Meds ordered this encounter  Medications  . zolpidem (AMBIEN) 10 MG tablet    Sig: Take 1 tablet (10 mg total) by mouth at bedtime.    Dispense:  30 tablet    Refill:  5  . lovastatin (MEVACOR) 40 MG tablet    Sig: Take 2 tablets (80 mg total) by mouth at bedtime.    Dispense:  60 tablet    Refill:  5    No orders of the defined types were placed in this encounter.     Diagnoses and all orders for this  visit:  Primary insomnia  Anxiety reaction  Grieving  Mixed hyperlipidemia  Other orders -     zolpidem (AMBIEN) 10 MG tablet; Take 1 tablet (10 mg total) by mouth at bedtime. -     lovastatin (MEVACOR) 40 MG tablet; Take 2 tablets (80 mg total) by mouth at bedtime.   Support offered for grief reaction. Pt. Expressed appreciation. Ambien not entirely  Effective. We discussed sleep hygiene and pt will avoid Tea & soda after noon. She will stop screen time one hour before bed. She will switch her daily bathing to one hour before bedtime.  Virtual Visit via telephone Note  I discussed the limitations, risks, security and privacy concerns of performing an evaluation and management service by telephone and the availability of in person appointments. The patient was identified with two identifiers. Pt.expressed understanding and agreed to proceed. Pt. Is at home. Dr. Darlyn Read is in his office.  Follow Up Instructions:   I discussed the assessment and treatment plan with the patient. The patient was provided an opportunity to ask questions and all were answered. The patient agreed with the plan and demonstrated an  understanding of the instructions.   The patient was advised to call back or seek an in-person evaluation if the symptoms worsen or if the condition fails to improve as anticipated.   Total minutes including chart review and phone contact time: 25   Follow up plan: No follow-ups on file.  Yvette Fraise, MD [place]

## 2019-02-01 NOTE — Patient Instructions (Signed)

## 2019-04-04 ENCOUNTER — Ambulatory Visit (INDEPENDENT_AMBULATORY_CARE_PROVIDER_SITE_OTHER): Payer: PRIVATE HEALTH INSURANCE | Admitting: Family Medicine

## 2019-04-04 ENCOUNTER — Encounter: Payer: Self-pay | Admitting: Family Medicine

## 2019-04-04 DIAGNOSIS — F418 Other specified anxiety disorders: Secondary | ICD-10-CM | POA: Diagnosis not present

## 2019-04-04 DIAGNOSIS — F5101 Primary insomnia: Secondary | ICD-10-CM | POA: Diagnosis not present

## 2019-04-04 DIAGNOSIS — F322 Major depressive disorder, single episode, severe without psychotic features: Secondary | ICD-10-CM | POA: Diagnosis not present

## 2019-04-04 MED ORDER — DULOXETINE HCL 30 MG PO CPEP: 30.0000 mg | ORAL_CAPSULE | Freq: Every day | ORAL | 0 refills | Status: DC

## 2019-04-04 NOTE — Progress Notes (Signed)
Subjective:    Patient ID: [name], female    DOB: Jul 07, 1960, 59 y.o.   MRN: [id number]   HPI: Yvette Rodgers is a 59 y.o. female presenting for under so much stress. Husband away for 9 months. I just don't know what to do. Takes Strong but then up again. Husband coming home on 3/14, but leaving after a few days for another job. Can't eat or sleeping. She has DCed Pepsi and other forms of caffeine. "I've never been like this before. " Can't relax, nervous. Tearful.   Depression screen [facility] 2/9 07/26/2017 06/08/2017 03/24/2017 02/17/2017 12/07/2016  Decreased Interest 0 0 0 0 0  Down, Depressed, Hopeless 0 0 0 0 0  PHQ - 2 Score 0 0 0 0 0     Relevant past medical, surgical, family and social history reviewed and updated as indicated.  Interim medical history since our last visit reviewed. Allergies and medications reviewed and updated.  ROS:  Review of Systems  Constitutional: Negative.   HENT: Negative.   Eyes: Negative for visual disturbance.  Respiratory: Negative for shortness of breath.   Cardiovascular: Negative for chest pain.  Gastrointestinal: Negative for abdominal pain.  Musculoskeletal: Negative for arthralgias.  Psychiatric/Behavioral: Positive for agitation, confusion, decreased concentration, dysphoric mood and sleep disturbance. The patient is nervous/anxious.      Social History   Tobacco Use  Smoking Status Current Every Day Smoker  . Packs/day: 0.50  Smokeless Tobacco Never Used       Objective:     Wt Readings from Last 3 Encounters:  07/26/17 136 lb (61.7 kg)  06/08/17 142 lb 6.4 oz (64.6 kg)  03/24/17 140 lb (63.5 kg)     Exam deferred. Pt. Harboring due to COVID 19. Phone visit performed.   Assessment & Plan:   1. Depression, major, single episode, severe (HCC)   2. Anxiety associated with depression   3. Primary insomnia     Meds ordered this encounter  Medications  . DULoxetine (CYMBALTA) 30 MG capsule    Sig: Take 1  capsule (30 mg total) by mouth daily. For one week then two daily. Take with a full stomach at suppertime    Dispense:  60 capsule    Refill:  0    No orders of the defined types were placed in this encounter.     Diagnoses and all orders for this visit:  Depression, major, single episode, severe (HCC) -     DULoxetine (CYMBALTA) 30 MG capsule; Take 1 capsule (30 mg total) by mouth daily. For one week then two daily. Take with a full stomach at suppertime  Anxiety associated with depression -     DULoxetine (CYMBALTA) 30 MG capsule; Take 1 capsule (30 mg total) by mouth daily. For one week then two daily. Take with a full stomach at suppertime  Primary insomnia  I believe her sleep will improve once she initiates the Cymbalta.  In the meantime this patient is socially isolated due to her husband's working far away.  She cannot go with him because he lives in a hotel where he is going and she has to take care of their animals.  Sometimes when there is a campground available she will go with him but unfortunately he is in the [place] where they campgrounds are closed due to the cold winter weather.  Covid tends to increase her isolation and anxiety as well.  She was encouraged for self-care with exercise careful diet and  we discussed trying to approach her narrative from a positive angle.  Virtual Visit via telephone Note  I discussed the limitations, risks, security and privacy concerns of performing an evaluation and management service by telephone and the availability of in person appointments. The patient was identified with two identifiers. Pt.expressed understanding and agreed to proceed. Pt. Is at home. Dr. Livia Snellen is in his office.  Follow Up Instructions:   I discussed the assessment and treatment plan with the patient. The patient was provided an opportunity to ask questions and all were answered. The patient agreed with the plan and demonstrated an understanding of the  instructions.   The patient was advised to call back or seek an in-person evaluation if the symptoms worsen or if the condition fails to improve as anticipated.   Total minutes including chart review and phone contact time: 21   Follow up plan: Return in about 1 month (around 05/05/2019).  [name], MD [place]

## 2019-04-05 ENCOUNTER — Telehealth: Payer: Self-pay | Admitting: Family Medicine

## 2019-04-05 NOTE — Telephone Encounter (Signed)
Patient states she has not had any side effects but was worried she might.  Will let us know if side effects occur.

## 2019-04-18 ENCOUNTER — Ambulatory Visit (INDEPENDENT_AMBULATORY_CARE_PROVIDER_SITE_OTHER): Payer: PRIVATE HEALTH INSURANCE | Admitting: Family Medicine

## 2019-04-18 ENCOUNTER — Encounter: Payer: Self-pay | Admitting: Family Medicine

## 2019-04-18 ENCOUNTER — Other Ambulatory Visit: Payer: Self-pay

## 2019-04-18 VITALS — BP 134/81 | HR 72 | Temp 98.6°F | Ht 62.0 in | Wt 144.8 lb

## 2019-04-18 DIAGNOSIS — F418 Other specified anxiety disorders: Secondary | ICD-10-CM | POA: Diagnosis not present

## 2019-04-18 DIAGNOSIS — F322 Major depressive disorder, single episode, severe without psychotic features: Secondary | ICD-10-CM | POA: Diagnosis not present

## 2019-04-18 DIAGNOSIS — F5101 Primary insomnia: Secondary | ICD-10-CM

## 2019-04-18 HISTORY — DX: Major depressive disorder, single episode, severe without psychotic features: F32.2

## 2019-04-18 MED ORDER — DULOXETINE HCL 60 MG PO CPEP: 60.0000 mg | ORAL_CAPSULE | Freq: Two times a day (BID) | ORAL | 2 refills | Status: AC

## 2019-04-18 NOTE — Progress Notes (Signed)
Subjective:  Patient ID: Yvette Rodgers. Cwik, female    DOB: 03-03-1960  Age: 59 y.o. MRN: [id number]  CC: Follow-up (2 week)   HPI Deania A. Profitt presents for med not doing anything for depression. Ambien  Helps with sleep. Still tearful due to missing her husband.  He was supposed to come home a week or so ago but emergency at his work in [facility] kept him from coming.  She has animals at home that keep her from traveling.  She has a 35 year old cat and a 34 year old Cocker spaniel.  Due to age she cannot give them away.  However she does not want to have them put down.  Generally when she travels with him she stays on time ground for the sake of her animals.  Unfortunately she is not able to do that due to the cold weather and [place] through the winter.  She is hoping to go with him to remain in the springtime, in May, when the campgrounds there start to open up.  In the meantime she gets some relief with Ambien but not enough.  She is still distraught and feels hopeless and sad and lonely.  PHQ 2 performed.  Does not show the extent of her symptoms.  Depression screen [facility] 2/9 04/18/2019 07/26/2017 06/08/2017  Decreased Interest 0 0 0  Down, Depressed, Hopeless 1 0 0  PHQ - 2 Score 1 0 0    History Tyquisha has a past medical history of Depression, major, single episode, severe (HCC) (04/18/2019), Hyperlipidemia, and Insomnia.   She has a past surgical history that includes Forearm surgery (Left, 2014).   Her family history includes Arthritis in her mother; Cancer in her sister; Hearing loss in her mother; Heart disease in her mother; Hypertension in her father and mother.She reports that she has been smoking. She has been smoking about 0.50 packs per day. She has never used smokeless tobacco. She reports current alcohol use of about 3.0 standard drinks of alcohol per week. She reports that she does not use drugs.    ROS Review of Systems  Constitutional: Negative.   HENT:  Negative.   Eyes: Negative for visual disturbance.  Respiratory: Negative for shortness of breath.   Cardiovascular: Negative for chest pain.  Gastrointestinal: Negative for abdominal pain.  Musculoskeletal: Negative for arthralgias.    Objective:  BP 134/81   Pulse 72   Temp 98.6 F (37 C) (Temporal)   Ht 5\' 2"  (1.575 m)   Wt 144 lb 12.8 oz (65.7 kg)   BMI 26.48 kg/m   BP Readings from Last 3 Encounters:  04/18/19 134/81  07/26/17 129/85  06/08/17 97/62    Wt Readings from Last 3 Encounters:  04/18/19 144 lb 12.8 oz (65.7 kg)  07/26/17 136 lb (61.7 kg)  06/08/17 142 lb 6.4 oz (64.6 kg)     Physical Exam Constitutional:      General: She is not in acute distress.    Appearance: Normal appearance. She is well-developed.  Cardiovascular:     Rate and Rhythm: Normal rate and regular rhythm.  Pulmonary:     Breath sounds: Normal breath sounds.  Skin:    General: Skin is warm and dry.  Neurological:     Mental Status: She is alert and oriented to person, place, and time.  Psychiatric:        Mood and Affect: Mood is anxious and depressed. Affect is tearful.       Assessment & Plan:  Durga was seen today for follow-up.  Diagnoses and all orders for this visit:  Depression, major, single episode, severe ([place]) -     DULoxetine (CYMBALTA) 60 MG capsule; Take 1 capsule (60 mg total) by mouth 2 (two) times daily.  Primary insomnia  Anxiety associated with depression -     DULoxetine (CYMBALTA) 60 MG capsule; Take 1 capsule (60 mg total) by mouth 2 (two) times daily.       I have changed Mardene Celeste A. Choplin's DULoxetine. I am also having her maintain her acetaminophen, zolpidem, and lovastatin.  Allergies as of 04/18/2019   No Known Allergies     Medication List       Accurate as of April 18, 2019  2:02 PM. If you have any questions, ask your nurse or doctor.        acetaminophen 650 MG CR tablet Commonly known as: Tylenol 8 Hour Take 1 tablet  (650 mg total) by mouth every 8 (eight) hours.   DULoxetine 60 MG capsule Commonly known as: Cymbalta Take 1 capsule (60 mg total) by mouth 2 (two) times daily. What changed:   medication strength  how much to take  when to take this  additional instructions Changed by: [name], MD   lovastatin 40 MG tablet Commonly known as: MEVACOR Take 2 tablets (80 mg total) by mouth at bedtime.   zolpidem 10 MG tablet Commonly known as: AMBIEN Take 1 tablet (10 mg total) by mouth at bedtime.      Controlled substance agreement executed for ambien  Follow-up: Return in about 1 month (around 05/19/2019).  [name], M.D.

## 2019-04-21 ENCOUNTER — Other Ambulatory Visit: Payer: Self-pay | Admitting: *Deleted

## 2019-04-21 DIAGNOSIS — Z79899 Other long term (current) drug therapy: Secondary | ICD-10-CM

## 2019-04-21 DIAGNOSIS — F5101 Primary insomnia: Secondary | ICD-10-CM

## 2019-04-21 NOTE — Telephone Encounter (Signed)
Left  Message, come in to do urine test for screening.
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
## 2019-05-29 ENCOUNTER — Telehealth: Payer: Self-pay | Admitting: Family Medicine

## 2019-05-29 ENCOUNTER — Other Ambulatory Visit: Payer: Self-pay | Admitting: Family Medicine

## 2019-05-29 MED ORDER — ZOLPIDEM TARTRATE 10 MG PO TABS: 10.0000 mg | ORAL_TABLET | Freq: Every day | ORAL | 5 refills | Status: AC

## 2019-05-29 NOTE — Telephone Encounter (Incomplete)
  Prescription Request  05/29/2019  What is the name of the medication or equipment? Ambien. Pt moved to [place] this weekend. She needs rx called into new pharmacy  Have you contacted your pharmacy to request a refill? (if applicable) ***  Which pharmacy would you like this sent to? [phone] [facility]   Patient notified that their request is being sent to the clinical staff for review and that they should receive a response within 2 business days.

## 2019-05-29 NOTE — Telephone Encounter (Signed)
Please let the patient know that I sent their prescription to their pharmacy. Thanks, WS 

## 2019-05-29 NOTE — Telephone Encounter (Signed)
Left message - rx requested has been sent to the pharmacy. 

## 2019-07-28 ENCOUNTER — Other Ambulatory Visit: Payer: Self-pay | Admitting: Family Medicine
